# Patient Record
Sex: Female | Born: 1980 | Hispanic: No | Marital: Married | Smoking: Never smoker
Health system: Southern US, Community
[De-identification: ages and names within clinical notes are randomized; demographics above are authoritative.]

## PROBLEM LIST (undated history)

## (undated) DIAGNOSIS — Z789 Other specified health status: Secondary | ICD-10-CM

## (undated) HISTORY — PX: APPENDECTOMY: SHX54

## (undated) SURGERY — Surgical Case
Anesthesia: *Unknown

---

## 2015-08-14 ENCOUNTER — Other Ambulatory Visit: Payer: Self-pay | Admitting: Internal Medicine

## 2015-08-14 DIAGNOSIS — N63 Unspecified lump in unspecified breast: Secondary | ICD-10-CM

## 2015-08-26 ENCOUNTER — Ambulatory Visit
Admission: RE | Admit: 2015-08-26 | Discharge: 2015-08-26 | Disposition: A | Discharge status: Home / Self Care | Payer: Managed Care, Other (non HMO) | Source: Ambulatory Visit | Attending: Internal Medicine | Admitting: Internal Medicine

## 2015-08-26 DIAGNOSIS — R928 Other abnormal and inconclusive findings on diagnostic imaging of breast: Secondary | ICD-10-CM | POA: Diagnosis not present

## 2015-08-26 DIAGNOSIS — Z331 Pregnant state, incidental: Secondary | ICD-10-CM | POA: Diagnosis not present

## 2015-08-26 DIAGNOSIS — N63 Unspecified lump in unspecified breast: Secondary | ICD-10-CM

## 2015-11-14 ENCOUNTER — Ambulatory Visit: Payer: Self-pay | Admitting: Family Medicine

## 2016-07-21 ENCOUNTER — Other Ambulatory Visit: Payer: Self-pay | Admitting: Obstetrics and Gynecology

## 2016-07-21 DIAGNOSIS — Z369 Encounter for antenatal screening, unspecified: Secondary | ICD-10-CM

## 2016-08-03 ENCOUNTER — Ambulatory Visit (HOSPITAL_BASED_OUTPATIENT_CLINIC_OR_DEPARTMENT_OTHER)
Admission: RE | Admit: 2016-08-03 | Discharge: 2016-08-03 | Disposition: A | Discharge status: Home / Self Care | Payer: Commercial Managed Care - PPO | Source: Ambulatory Visit | Attending: Obstetrics and Gynecology | Admitting: Obstetrics and Gynecology

## 2016-08-03 ENCOUNTER — Ambulatory Visit
Admission: RE | Admit: 2016-08-03 | Discharge: 2016-08-03 | Disposition: A | Discharge status: Home / Self Care | Payer: Commercial Managed Care - PPO | Source: Ambulatory Visit | Attending: Obstetrics and Gynecology | Admitting: Obstetrics and Gynecology

## 2016-08-03 ENCOUNTER — Encounter: Payer: Self-pay | Admitting: *Deleted

## 2016-08-03 VITALS — BP 121/75 | HR 101 | Temp 98.0°F | Resp 18 | Ht 67.0 in | Wt 162.0 lb

## 2016-08-03 DIAGNOSIS — D181 Lymphangioma, any site: Secondary | ICD-10-CM | POA: Diagnosis not present

## 2016-08-03 DIAGNOSIS — O358XX0 Maternal care for other (suspected) fetal abnormality and damage, not applicable or unspecified: Secondary | ICD-10-CM | POA: Diagnosis not present

## 2016-08-03 DIAGNOSIS — O3411 Maternal care for benign tumor of corpus uteri, first trimester: Secondary | ICD-10-CM | POA: Diagnosis not present

## 2016-08-03 DIAGNOSIS — O09521 Supervision of elderly multigravida, first trimester: Secondary | ICD-10-CM | POA: Diagnosis not present

## 2016-08-03 DIAGNOSIS — Q792 Exomphalos: Secondary | ICD-10-CM | POA: Insufficient documentation

## 2016-08-03 DIAGNOSIS — Z3A12 12 weeks gestation of pregnancy: Secondary | ICD-10-CM | POA: Diagnosis not present

## 2016-08-03 DIAGNOSIS — Z369 Encounter for antenatal screening, unspecified: Secondary | ICD-10-CM

## 2016-08-03 DIAGNOSIS — Z8279 Family history of other congenital malformations, deformations and chromosomal abnormalities: Secondary | ICD-10-CM | POA: Insufficient documentation

## 2016-08-03 DIAGNOSIS — IMO0002 Reserved for concepts with insufficient information to code with codable children: Secondary | ICD-10-CM | POA: Insufficient documentation

## 2016-08-03 DIAGNOSIS — IMO0001 Reserved for inherently not codable concepts without codable children: Secondary | ICD-10-CM

## 2016-08-03 HISTORY — DX: Other specified health status: Z78.9

## 2016-08-03 NOTE — Progress Notes (Signed)
Referring Provider:   [facility] Length of Consultation: 50 minutes  Sarah Atkinson was referred to [facility] for genetic counseling because of advanced maternal age.  The patient will be 36 years old at the time of delivery.  This note summarizes the information we discussed.    We explained that the chance of a chromosome abnormality increases with maternal age.  Chromosomes and examples of chromosome problems were reviewed.  Humans typically have 46 chromosomes in each cell, with half passed through each sperm and egg.  Any change in the number or structure of chromosomes can increase the risk of problems in the physical and mental development of a pregnancy.   Based upon age of the patient, the chance of any chromosome abnormality was 1 in 95. The chance of Down syndrome, the most common chromosome problem associated with maternal age, was 1 in 62.  The risk of chromosome problems is in addition to the 3% general population risk for birth defects and mental retardation.  The greatest chance, of course, is that the baby would be born in good health.  We discussed the following prenatal screening and testing options for this pregnancy:  First trimester screening, which includes nuchal translucency ultrasound screen and first trimester maternal serum marker screening.  The nuchal translucency has approximately an 80% detection rate for Down syndrome and can be positive for other chromosome abnormalities as well as heart defects.  When combined with a maternal serum marker screening, the detection rate is up to 90% for Down syndrome and up to 97% for trisomy 18.     The chorionic villus sampling procedure is available for first trimester chromosome analysis.  This involves the withdrawal of a small amount of chorionic villi (tissue from the developing placenta).  Risk of pregnancy loss is estimated to be approximately 1 in 200 to 1 in 100 (0.5 to 1%).  There  is approximately a 1% (1 in 100) chance that the CVS chromosome results will be unclear.  Chorionic villi cannot be tested for neural tube defects.     Maternal serum marker screening, a blood test that measures pregnancy proteins, can provide risk assessments for Down syndrome, trisomy 18, and open neural tube defects (spina bifida, anencephaly). Because it does not directly examine the fetus, it cannot positively diagnose or rule out these problems.  Targeted ultrasound uses high frequency sound waves to create an image of the developing fetus.  An ultrasound is often recommended as a routine means of evaluating the pregnancy.  It is also used to screen for fetal anatomy problems (for example, a heart defect) that might be suggestive of a chromosomal or other abnormality.   Amniocentesis involves the removal of a small amount of amniotic fluid from the sac surrounding the fetus with the use of a thin needle inserted through the maternal abdomen and uterus.  Ultrasound guidance is used throughout the procedure.  Fetal cells from amniotic fluid are directly evaluated and > 99.5% of chromosome problems and > 98% of open neural tube defects can be detected. This procedure is generally performed after the 15th week of pregnancy.  The main risks to this procedure include complications leading to miscarriage in less than 1 in 200 cases (0.5%).  We also reviewed the availability of cell free fetal DNA testing from maternal blood to determine whether or not the baby may have either Down syndrome, trisomy 71, or trisomy 43.  This test utilizes a maternal blood sample and  DNA sequencing technology to isolate circulating cell free fetal DNA from maternal plasma.  The fetal DNA can then be analyzed for DNA sequences that are derived from the three most common chromosomes involved in aneuploidy, chromosomes 13, 18, and 21.  If the overall amount of DNA is greater than the expected level for any of these chromosomes,  aneuploidy is suspected.  While we do not consider it a replacement for invasive testing and karyotype analysis, a negative result from this testing would be reassuring, though not a guarantee of a normal chromosome complement for the baby.  An abnormal result is certainly suggestive of an abnormal chromosome complement, though we would still recommend CVS or amniocentesis to confirm any findings from this testing.  Cystic Fibrosis and Spinal Muscular Atrophy (SMA) screening were also discussed with the patient. Both conditions are recessive, which means that both parents must be carriers in order to have a child with the disease.  Cystic fibrosis (CF) is one of the most common genetic conditions in persons of Caucasian ancestry.  This condition occurs in approximately 1 in 2,500 Caucasian persons and results in thickened secretions in the lungs, digestive, and reproductive systems.  For a baby to be at risk for having CF, both of the parents must be carriers for this condition.  Approximately 1 in 91 Caucasian persons is a carrier for CF.  Current carrier testing looks for the most common mutations in the gene for CF and can detect approximately 90% of carriers in the Caucasian population.  This means that the carrier screening can greatly reduce, but cannot eliminate, the chance for an individual to have a child with CF.  If an individual is found to be a carrier for CF, then carrier testing would be available for the partner. As part of [place] newborn screening profile, all babies born in the state of [place] will have a two-tier screening process.  Specimens are first tested to determine the concentration of immunoreactive trypsinogen (IRT).  The top 5% of specimens with the highest IRT values then undergo DNA testing using a panel of over 40 common CF mutations. SMA is a neurodegenerative disorder that leads to atrophy of skeletal muscle and overall weakness.  This condition is also more  prevalent in the Caucasian population, with 1 in 40-1 in 60 persons being a carrier and 1 in 6,000-1 in 10,000 children being affected.  There are multiple forms of the disease, with some causing death in infancy to other forms with survival into adulthood.  The genetics of SMA is complex, but carrier screening can detect up to 95% of carriers in the Caucasian population.  Similar to CF, a negative result can greatly reduce, but cannot eliminate, the chance to have a child with SMA.  We obtained a detailed family history and pregnancy history.  Sarah Atkinson reported one paternal aunt with Down syndrome.  This aunt was the youngest of 18 siblings in her father's family, and she was born when their mother was much older.  Down syndrome is caused by an extra copy of the genetic instructions located on chromosome number 21.  These extra instructions result in the characteristic facial appearance, developmental delays, and an increased risk for some types of birth defects.  The majority (95%) of persons with Down syndrome have three freestanding copies of chromosome number 21, called trisomy 42.  This type of Down syndrome occurs as a sporadic condition, often related to advanced maternal age, and does not increase the  chance for other family members to have Down syndrome.  Rarely, Down syndrome is caused by a rearrangement of the genetic instructions, where the extra chromosome number 21 is attached to another chromosome.  This type of chromosome rearrangement can be passed down through families and therefore may increase the chance for Down syndrome in other family members.  We cannot determine which type of Down syndrome is present without documentation of chromosome studies in the affected family member.  If any additional information is obtained about this history, please do not hesitate to contact us. In addition, Wilton's brother has a son with autistic features.  Autism spectrum disorders may have many  different causes, including genetic syndromes. In order to determine the chance for other family members to have a similar condition, more medical information would be needed.  Fragile X syndrome, the most common inherited form of developmental delay and a common syndrome associated with autism, would not be concerning for this pregnancy because it cannot be passed from father to son.  The remainder of the family history is unremarkable for birth defects, developmental delays, recurrent pregnancy loss or known chromosome abnormalities.  Sarah Atkinson stated that this is her third pregnancy.  She and her husband, Lavone Neri, have a three year old son who is in good health.  The other pregnancy was an elective termination for personal reasons.  She reported no complications or exposures in this pregnancy that would be expected to increase the risk for birth defects.  She had taken sleeping pills from [place] occasionally prior to her missed period, but stopped at 4 weeks.  She was not sure of the name or the contents of the pills.  We reviewed that this was most likely in the all or none period and would have little concern, but that without knowing the contents, we cannot accurately assess the risks from this exposure.    After consideration of the options, Sarah Atkinson elected to proceed with first trimester screening.  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  Detailed views of the fetal anatomy could not be assessed due to early gestational age.  Please refer to the ultrasound report for details of that study.  We met and discussed these results further.  When cystic hygroma is identified on prenatal ultrasound, there is a significantly increased chance for a chromosome condition, heart defect or other genetic syndrome. We reviewed that up to 60% of fetuses with a cystic hygroma have chromosome abnormalities, including Turner  Syndrome, Down Syndrome and trisomy 35 or 13. The additional finding of omphalocele would increase this risk further.  We reviewed testing options including chorionic villus sampling, cell free fetal DNA analysis, expectant management and the option of termination of pregnancy. An increased risk of miscarriage is also associated with a cystic hygroma.   The patient elected to proceed with cell free fetal DNA testing today.  We will plan to speak by phone when those results become available (usually in about 1 week).  At that time, we will make a plan for follow up based upon her wishes.  Sarah Atkinson was encouraged to call with questions or concerns.  We can be contacted at [phone].    Wilburt Finlay, MS, CGC

## 2016-08-03 NOTE — Progress Notes (Signed)
Sarah Larry, MS, CGC performed an integral service incident to the physician's initial service.  I was physically present in the clinical area and was immediately available to render assistance.   [name] [name]

## 2016-08-09 LAB — INFORMASEQ(SM) WITH XY ANALYSIS
Chromosome 18 Result: DETECTED
Fetal Fraction (%):: 7.8
Fetal Number: 1
Gestational Age at Collection: 12.4 weeks
Screen Result: DETECTED
Weight: 162 [lb_av]

## 2016-08-10 ENCOUNTER — Telehealth: Payer: Self-pay | Admitting: Obstetrics and Gynecology

## 2016-08-10 NOTE — Telephone Encounter (Signed)
I spoke with Sarah Atkinson and her husband, Sarah Atkinson, by phone today with the results of the cell free DNA testing performed on 08/03/2016 at [facility].  This testing was offered based upon maternal age and the findings of cystic hygroma, lymphangiectasia and omphalocele on ultrasound at 12 weeks.    To review, circulating cell free fetal DNA testing may be used to determine whether or not the baby may have either Down syndrome, trisomy 84, or trisomy 54. This test utilizes a maternal blood sample and DNA sequencing technology to isolate circulating cell free fetal DNA from maternal plasma. The fetal DNA can then be analyzed for DNA sequences that are derived from the three most common chromosomes involved in aneuploidy, chromosomes 13, 18, and 21. If the overall amount of DNA is greater than the expected level for any of these chromosomes, aneuploidy is suspected. This testing is able to provide another means of determining the chance for one of these common chromosome conditions, without requiring an invasive procedure and traditional karyotype analysis. We explained that we do not consider it a replacement for invasive testing and karyotype analysis.  A negative result from this testing is very reassuring, though not a guarantee of a normal chromosome complement for the baby. An abnormal result is certainly suggestive of an abnormal chromosome complement, though we would still recommend CVS or amniocentesis to confirm any findings from this testing.  Results of the InformaSeq testing were abnormal, showing a high risk for trisomy 18 in the pregnancy.  This test is estimated to be 98% accurate for trisomy 18.  We then talked about the high likelihood for this chromosome condition in the pregnancy given these results along with the prior clinical findings.  However, this is not diagnostic and therefore we offered the option of CVS or amniocentesis for confirmation of the fetal  karyotype as well as detailed ultrasound and echocardiogram to provide more information regarding the health of this baby.  We also discussed continuing the pregnancy and the option of termination prior to [redacted] weeks gestation.  Sarah Atkinson stated that she desires termination of pregnancy given these results.  At her request, we contacted the [facility] at [facility] and provided her clinical information.  They will call her insurance company to determine coverage and call the patient as soon as possible with cost information and to schedule as desired.  We discussed the option of chromosome analysis at the time of termination to confirm the diagnosis and verify trisomy vs. Translocation trisomy 11.  The patient was advised that this would be helpful in accurately determining recurrence risks.  She and her husband plan to consider this.  We review recurrence chances for chromosome conditions, as the patient was asking about future pregnancies.  If this is trisomy 58, then the recurrence chance would be 1% or her age related risk, whichever is higher (her age realted chance after age 69).  If there is translocation trisomy 40, then the recurrence would depend upon if either parent was a carrier.  The patient and her husband have my cell number, and can call me with questions.  Our clinic number is [phone], and I may be reached there on Mondays and Thursdays.  I have spoken with [facility] about these results as well.  Wilburt Finlay, MS, CGC

## 2017-05-07 ENCOUNTER — Encounter (HOSPITAL_COMMUNITY): Payer: Self-pay

## 2017-08-26 ENCOUNTER — Other Ambulatory Visit: Payer: Self-pay | Admitting: Internal Medicine

## 2017-08-26 DIAGNOSIS — J42 Unspecified chronic bronchitis: Secondary | ICD-10-CM

## 2017-08-26 DIAGNOSIS — J4541 Moderate persistent asthma with (acute) exacerbation: Secondary | ICD-10-CM

## 2017-09-03 ENCOUNTER — Ambulatory Visit
Admission: RE | Admit: 2017-09-03 | Discharge: 2017-09-03 | Disposition: A | Discharge status: Home / Self Care | Payer: Commercial Managed Care - PPO | Source: Ambulatory Visit | Attending: Internal Medicine | Admitting: Internal Medicine

## 2017-09-03 DIAGNOSIS — J4541 Moderate persistent asthma with (acute) exacerbation: Secondary | ICD-10-CM

## 2017-09-03 DIAGNOSIS — R062 Wheezing: Secondary | ICD-10-CM | POA: Diagnosis present

## 2017-09-03 DIAGNOSIS — R05 Cough: Secondary | ICD-10-CM | POA: Diagnosis not present

## 2017-09-03 DIAGNOSIS — J42 Unspecified chronic bronchitis: Secondary | ICD-10-CM

## 2017-09-03 DIAGNOSIS — R0602 Shortness of breath: Secondary | ICD-10-CM | POA: Insufficient documentation

## 2017-09-30 ENCOUNTER — Other Ambulatory Visit: Payer: Self-pay | Admitting: Specialist

## 2017-09-30 DIAGNOSIS — R059 Cough, unspecified: Secondary | ICD-10-CM

## 2017-09-30 DIAGNOSIS — R05 Cough: Secondary | ICD-10-CM

## 2017-09-30 DIAGNOSIS — J4541 Moderate persistent asthma with (acute) exacerbation: Secondary | ICD-10-CM

## 2017-10-14 ENCOUNTER — Ambulatory Visit: Payer: Commercial Managed Care - PPO

## 2017-10-28 ENCOUNTER — Ambulatory Visit: Payer: Commercial Managed Care - PPO

## 2017-11-02 ENCOUNTER — Ambulatory Visit: Payer: Commercial Managed Care - PPO | Attending: Specialist

## 2017-11-02 DIAGNOSIS — J4541 Moderate persistent asthma with (acute) exacerbation: Secondary | ICD-10-CM

## 2017-11-02 DIAGNOSIS — R05 Cough: Secondary | ICD-10-CM | POA: Diagnosis not present

## 2017-11-02 DIAGNOSIS — R059 Cough, unspecified: Secondary | ICD-10-CM

## 2017-11-02 MED ORDER — METHACHOLINE 0.0625 MG/ML NEB SOLN
2.0000 mL | Freq: Once | RESPIRATORY_TRACT | Status: AC
  Administered 2017-11-02: 0.125 mg via RESPIRATORY_TRACT
  Filled 2017-11-02: qty 2

## 2017-11-02 MED ORDER — METHACHOLINE 0.25 MG/ML NEB SOLN
2.0000 mL | Freq: Once | RESPIRATORY_TRACT | Status: AC
  Administered 2017-11-02: 0.5 mg via RESPIRATORY_TRACT
  Filled 2017-11-02: qty 2

## 2017-11-02 MED ORDER — METHACHOLINE 4 MG/ML NEB SOLN
2.0000 mL | Freq: Once | RESPIRATORY_TRACT | Status: AC
  Administered 2017-11-02: 8 mg via RESPIRATORY_TRACT
  Filled 2017-11-02: qty 2

## 2017-11-02 MED ORDER — SODIUM CHLORIDE 0.9 % IN NEBU
3.0000 mL | INHALATION_SOLUTION | Freq: Once | RESPIRATORY_TRACT | Status: AC
  Administered 2017-11-02: 3 mL via RESPIRATORY_TRACT

## 2017-11-02 MED ORDER — METHACHOLINE 16 MG/ML NEB SOLN
2.0000 mL | Freq: Once | RESPIRATORY_TRACT | Status: AC
  Administered 2017-11-02: 32 mg via RESPIRATORY_TRACT
  Filled 2017-11-02: qty 2

## 2017-11-02 MED ORDER — ALBUTEROL SULFATE (2.5 MG/3ML) 0.083% IN NEBU
2.5000 mg | INHALATION_SOLUTION | Freq: Once | RESPIRATORY_TRACT | Status: AC
  Filled 2017-11-02: qty 3

## 2017-11-02 MED ORDER — METHACHOLINE 1 MG/ML NEB SOLN
2.0000 mL | Freq: Once | RESPIRATORY_TRACT | Status: AC
  Administered 2017-11-02: 2 mg via RESPIRATORY_TRACT
  Filled 2017-11-02: qty 2

## 2018-03-31 ENCOUNTER — Other Ambulatory Visit: Payer: Self-pay | Admitting: Obstetrics and Gynecology

## 2018-03-31 DIAGNOSIS — Z1231 Encounter for screening mammogram for malignant neoplasm of breast: Secondary | ICD-10-CM

## 2018-04-07 ENCOUNTER — Encounter: Payer: Self-pay | Admitting: Radiology

## 2018-04-07 ENCOUNTER — Ambulatory Visit
Admission: RE | Admit: 2018-04-07 | Discharge: 2018-04-07 | Disposition: A | Discharge status: Home / Self Care | Payer: Commercial Managed Care - PPO | Source: Ambulatory Visit | Attending: Obstetrics and Gynecology | Admitting: Obstetrics and Gynecology

## 2018-04-07 DIAGNOSIS — Z1231 Encounter for screening mammogram for malignant neoplasm of breast: Secondary | ICD-10-CM | POA: Diagnosis present

## 2018-05-18 DIAGNOSIS — E119 Type 2 diabetes mellitus without complications: Secondary | ICD-10-CM | POA: Diagnosis not present

## 2018-06-13 DIAGNOSIS — R87612 Low grade squamous intraepithelial lesion on cytologic smear of cervix (LGSIL): Secondary | ICD-10-CM | POA: Diagnosis not present

## 2018-06-13 DIAGNOSIS — G4709 Other insomnia: Secondary | ICD-10-CM | POA: Diagnosis not present

## 2018-06-18 DIAGNOSIS — E119 Type 2 diabetes mellitus without complications: Secondary | ICD-10-CM | POA: Diagnosis not present

## 2018-07-04 DIAGNOSIS — N912 Amenorrhea, unspecified: Secondary | ICD-10-CM | POA: Diagnosis not present

## 2018-07-05 ENCOUNTER — Other Ambulatory Visit: Payer: Self-pay | Admitting: Obstetrics and Gynecology

## 2018-07-05 DIAGNOSIS — Z369 Encounter for antenatal screening, unspecified: Secondary | ICD-10-CM

## 2018-07-07 DIAGNOSIS — O209 Hemorrhage in early pregnancy, unspecified: Secondary | ICD-10-CM | POA: Diagnosis not present

## 2018-07-07 DIAGNOSIS — O418X10 Other specified disorders of amniotic fluid and membranes, first trimester, not applicable or unspecified: Secondary | ICD-10-CM | POA: Diagnosis not present

## 2018-07-07 DIAGNOSIS — O468X1 Other antepartum hemorrhage, first trimester: Secondary | ICD-10-CM | POA: Diagnosis not present

## 2018-07-15 DIAGNOSIS — J40 Bronchitis, not specified as acute or chronic: Secondary | ICD-10-CM | POA: Diagnosis not present

## 2018-07-17 DIAGNOSIS — E119 Type 2 diabetes mellitus without complications: Secondary | ICD-10-CM | POA: Diagnosis not present

## 2018-08-03 DIAGNOSIS — R0989 Other specified symptoms and signs involving the circulatory and respiratory systems: Secondary | ICD-10-CM | POA: Diagnosis not present

## 2018-08-08 ENCOUNTER — Ambulatory Visit: Payer: Commercial Managed Care - PPO

## 2018-08-15 DIAGNOSIS — N920 Excessive and frequent menstruation with regular cycle: Secondary | ICD-10-CM | POA: Diagnosis not present

## 2018-08-15 DIAGNOSIS — D219 Benign neoplasm of connective and other soft tissue, unspecified: Secondary | ICD-10-CM | POA: Diagnosis not present

## 2018-08-15 DIAGNOSIS — D251 Intramural leiomyoma of uterus: Secondary | ICD-10-CM | POA: Diagnosis not present

## 2018-08-17 DIAGNOSIS — D251 Intramural leiomyoma of uterus: Secondary | ICD-10-CM | POA: Diagnosis not present

## 2018-08-17 DIAGNOSIS — D219 Benign neoplasm of connective and other soft tissue, unspecified: Secondary | ICD-10-CM | POA: Diagnosis not present

## 2018-08-17 DIAGNOSIS — E119 Type 2 diabetes mellitus without complications: Secondary | ICD-10-CM | POA: Diagnosis not present

## 2018-08-17 DIAGNOSIS — R300 Dysuria: Secondary | ICD-10-CM | POA: Diagnosis not present

## 2018-08-17 DIAGNOSIS — N920 Excessive and frequent menstruation with regular cycle: Secondary | ICD-10-CM | POA: Diagnosis not present

## 2018-11-02 ENCOUNTER — Other Ambulatory Visit: Payer: Self-pay | Admitting: Obstetrics and Gynecology

## 2018-11-02 DIAGNOSIS — D250 Submucous leiomyoma of uterus: Secondary | ICD-10-CM

## 2018-11-09 ENCOUNTER — Other Ambulatory Visit: Payer: Commercial Managed Care - PPO

## 2019-06-28 ENCOUNTER — Other Ambulatory Visit: Payer: Self-pay | Admitting: Unknown Physician Specialty

## 2019-06-28 DIAGNOSIS — R1310 Dysphagia, unspecified: Secondary | ICD-10-CM

## 2019-06-28 DIAGNOSIS — K219 Gastro-esophageal reflux disease without esophagitis: Secondary | ICD-10-CM

## 2019-07-06 ENCOUNTER — Ambulatory Visit: Payer: Commercial Managed Care - PPO

## 2019-07-16 ENCOUNTER — Ambulatory Visit: Payer: Commercial Managed Care - PPO | Attending: Internal Medicine

## 2019-07-16 DIAGNOSIS — Z23 Encounter for immunization: Secondary | ICD-10-CM | POA: Insufficient documentation

## 2019-07-16 NOTE — Progress Notes (Signed)
   Covid-19 Vaccination Clinic  Name:  Sarah Atkinson    MRN: [id number] DOB: 1980/06/20  07/16/2019  [name] was observed post Covid-19 immunization for 15 minutes without incidence. She was provided with Vaccine Information Sheet and instruction to access the V-Safe system.   [name] was instructed to call 911 with any severe reactions post vaccine: Marland Kitchen Difficulty breathing  . Swelling of your face and throat  . A fast heartbeat  . A bad rash all over your body  . Dizziness and weakness    Immunizations Administered    Name Date Dose VIS Date Route   Pfizer COVID-19 Vaccine 07/16/2019  2:49 PM 0.3 mL 04/28/2019 Intramuscular   Manufacturer: [place]   Lot: [id number]   [place]: [id number]

## 2019-08-09 ENCOUNTER — Ambulatory Visit: Payer: Commercial Managed Care - PPO | Attending: Internal Medicine

## 2019-08-09 DIAGNOSIS — Z23 Encounter for immunization: Secondary | ICD-10-CM

## 2019-08-09 NOTE — Progress Notes (Signed)
   Covid-19 Vaccination Clinic  Name:  Sarah Atkinson    MRN: [id number] DOB: 1980-10-22  08/09/2019  Sarah Atkinson was observed post Covid-19 immunization for 15 minutes without incident. She was provided with Vaccine Information Sheet and instruction to access the V-Safe system.   Sarah Atkinson was instructed to call 911 with any severe reactions post vaccine: Marland Kitchen Difficulty breathing  . Swelling of face and throat  . A fast heartbeat  . A bad rash all over body  . Dizziness and weakness   Immunizations Administered    Name Date Dose VIS Date Route   Pfizer COVID-19 Vaccine 08/09/2019  3:18 PM 0.3 mL 04/28/2019 Intramuscular   Manufacturer: [place]   Lot: [id number]   [place]: [id number]

## 2020-05-22 ENCOUNTER — Other Ambulatory Visit: Payer: Commercial Managed Care - PPO

## 2020-05-23 ENCOUNTER — Other Ambulatory Visit: Payer: Commercial Managed Care - PPO

## 2021-01-30 ENCOUNTER — Other Ambulatory Visit: Payer: Self-pay | Admitting: Internal Medicine

## 2021-01-30 DIAGNOSIS — Z1231 Encounter for screening mammogram for malignant neoplasm of breast: Secondary | ICD-10-CM

## 2021-02-13 ENCOUNTER — Other Ambulatory Visit: Payer: Self-pay

## 2021-02-13 ENCOUNTER — Ambulatory Visit
Admission: RE | Admit: 2021-02-13 | Discharge: 2021-02-13 | Disposition: A | Discharge status: Home / Self Care | Payer: BC Managed Care – PPO | Source: Ambulatory Visit | Attending: Internal Medicine | Admitting: Internal Medicine

## 2021-02-13 DIAGNOSIS — Z1231 Encounter for screening mammogram for malignant neoplasm of breast: Secondary | ICD-10-CM | POA: Diagnosis present

## 2022-01-08 ENCOUNTER — Other Ambulatory Visit: Payer: Self-pay | Admitting: Internal Medicine

## 2022-01-08 DIAGNOSIS — Z1231 Encounter for screening mammogram for malignant neoplasm of breast: Secondary | ICD-10-CM

## 2022-02-17 ENCOUNTER — Ambulatory Visit
Admission: RE | Admit: 2022-02-17 | Discharge: 2022-02-17 | Disposition: A | Discharge status: Home / Self Care | Payer: Commercial Managed Care - PPO | Source: Ambulatory Visit | Attending: Internal Medicine | Admitting: Internal Medicine

## 2022-02-17 DIAGNOSIS — Z1231 Encounter for screening mammogram for malignant neoplasm of breast: Secondary | ICD-10-CM | POA: Insufficient documentation

## 2022-03-28 IMAGING — MG MM DIGITAL SCREENING BILAT W/ TOMO AND CAD
8 series · 9 of 24 positions shown · non-contrast
Comparison: Previous exam(s).

CLINICAL DATA: Screening.

EXAM:
DIGITAL SCREENING BILATERAL MAMMOGRAM WITH TOMOSYNTHESIS AND CAD
TECHNIQUE: Bilateral screening digital craniocaudal and mediolateral oblique
mammograms were obtained. Bilateral screening digital breast
tomosynthesis was performed. The images were evaluated with
computer-aided detection.

[R MLO synth-2D]
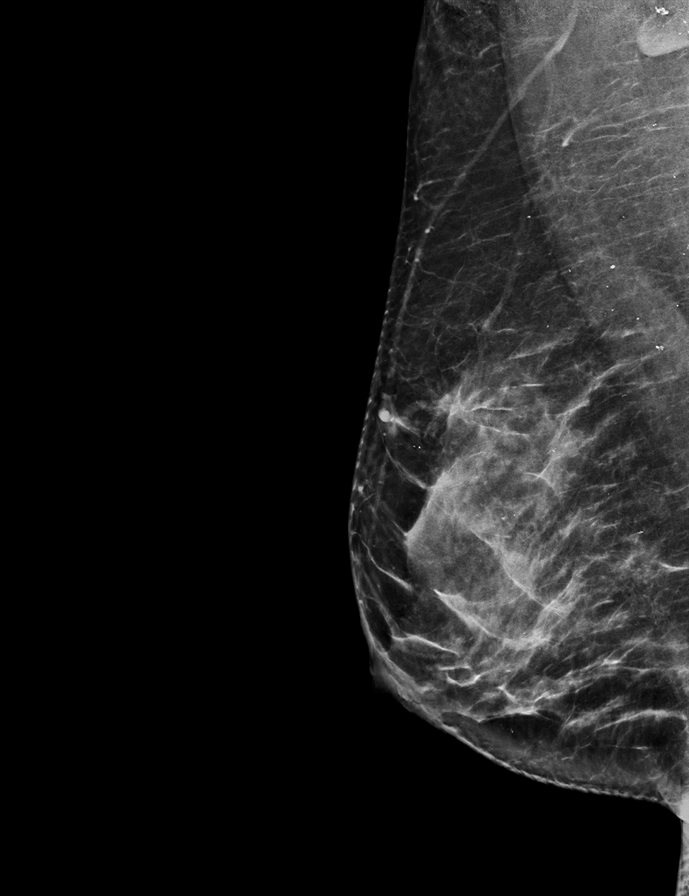

[L CC synth-2D]
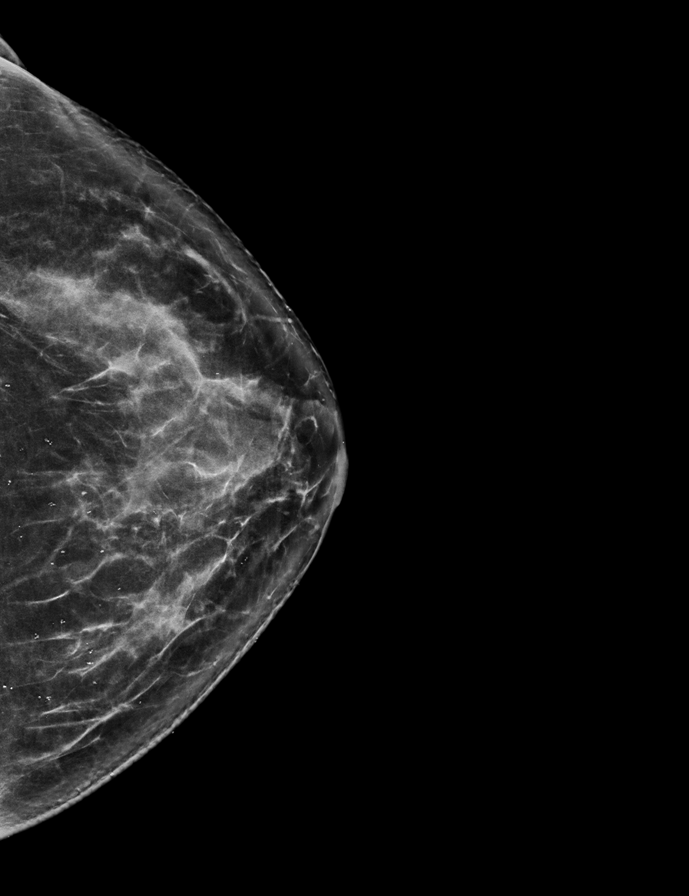

[L MLO synth-2D]
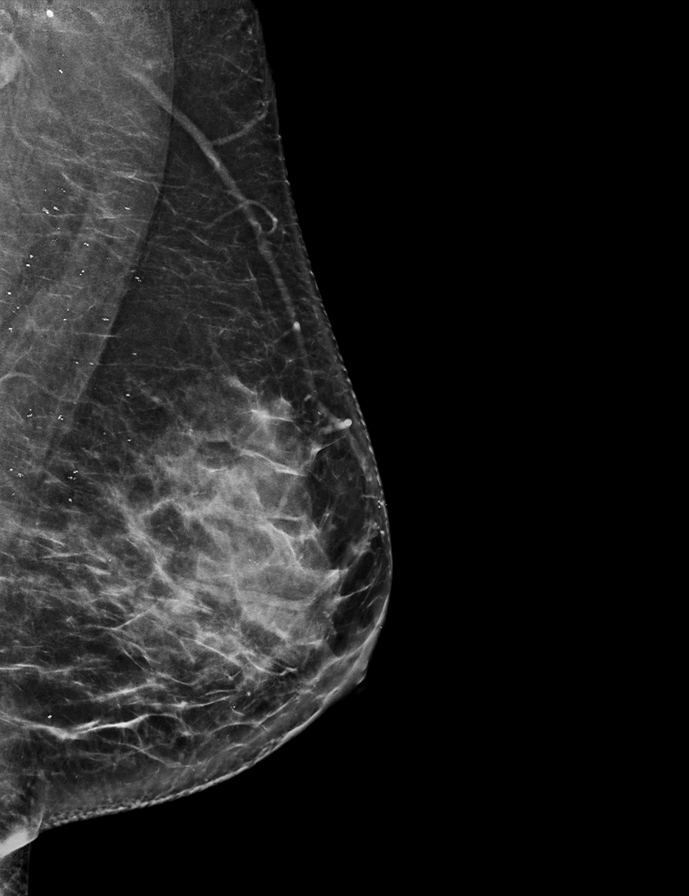

[R CC synth-2D]
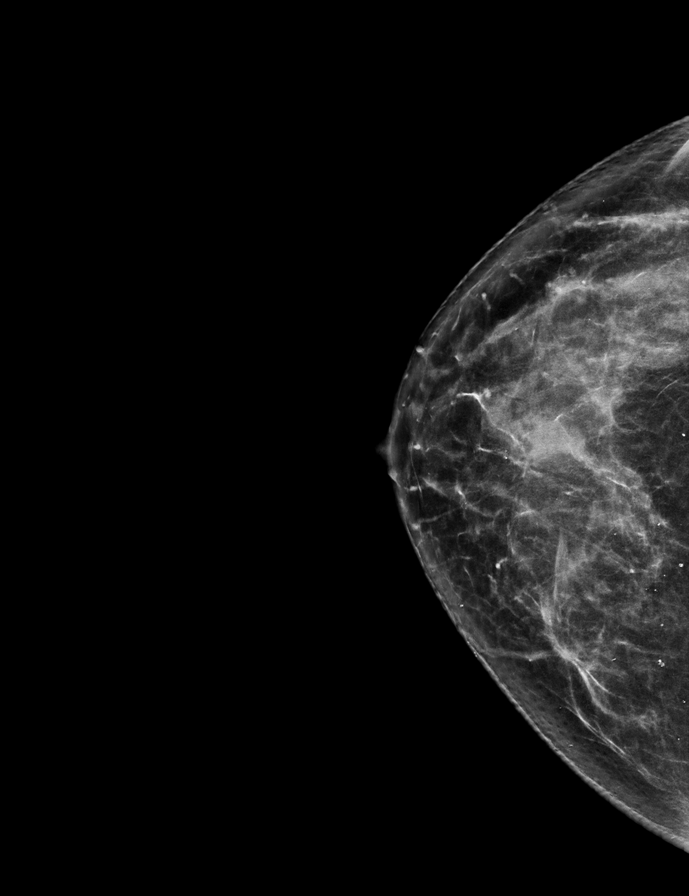

[R MLO tomo · 2 of 73 frames shown]
[frame 24/73]
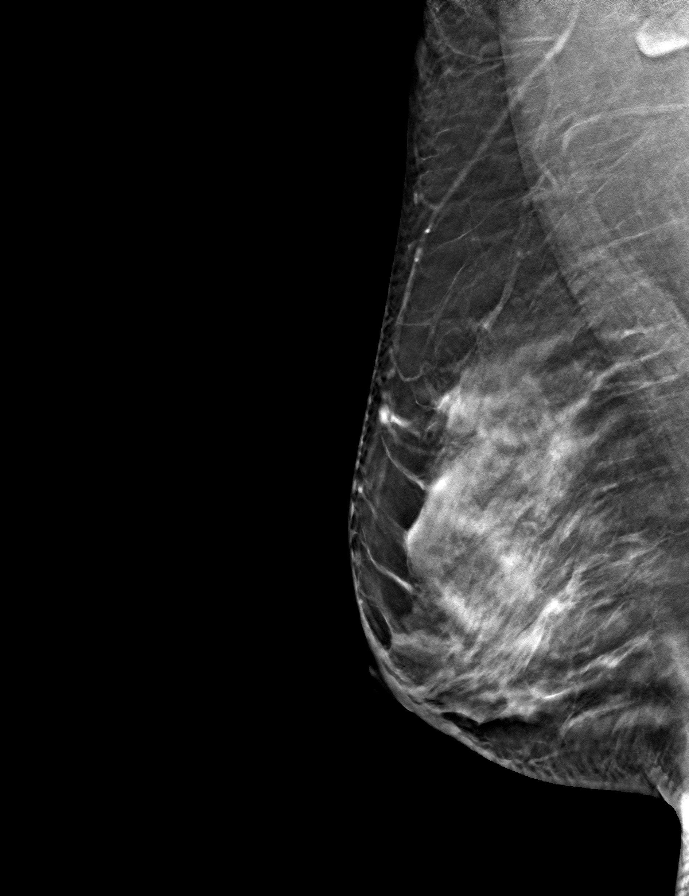
[frame 37/73]
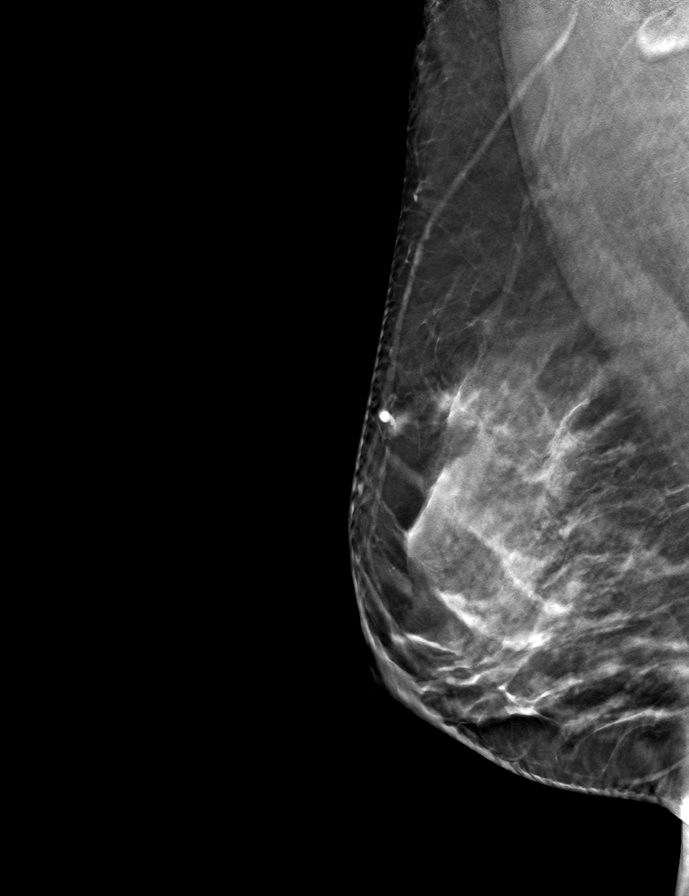

[R CC tomo · tomo slice 33/65.0]
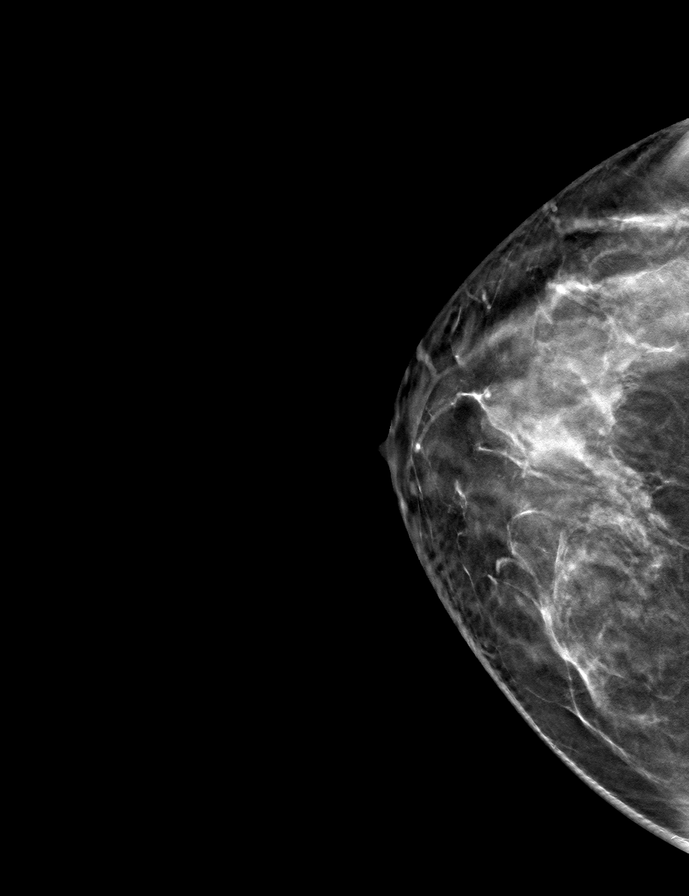

[L CC tomo · tomo slice 36/71.0]
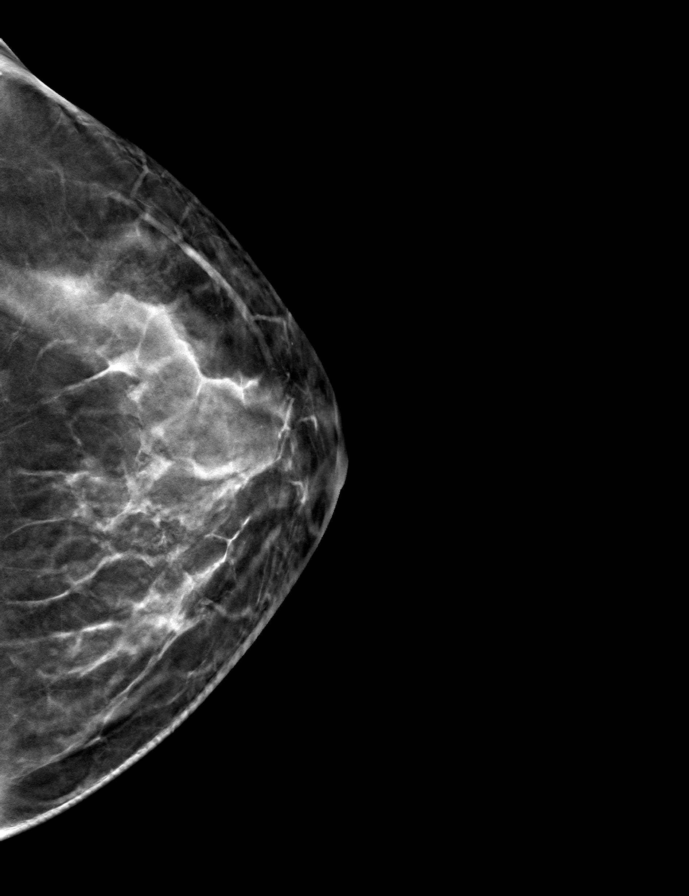

[L MLO tomo · tomo slice 38/75.0]
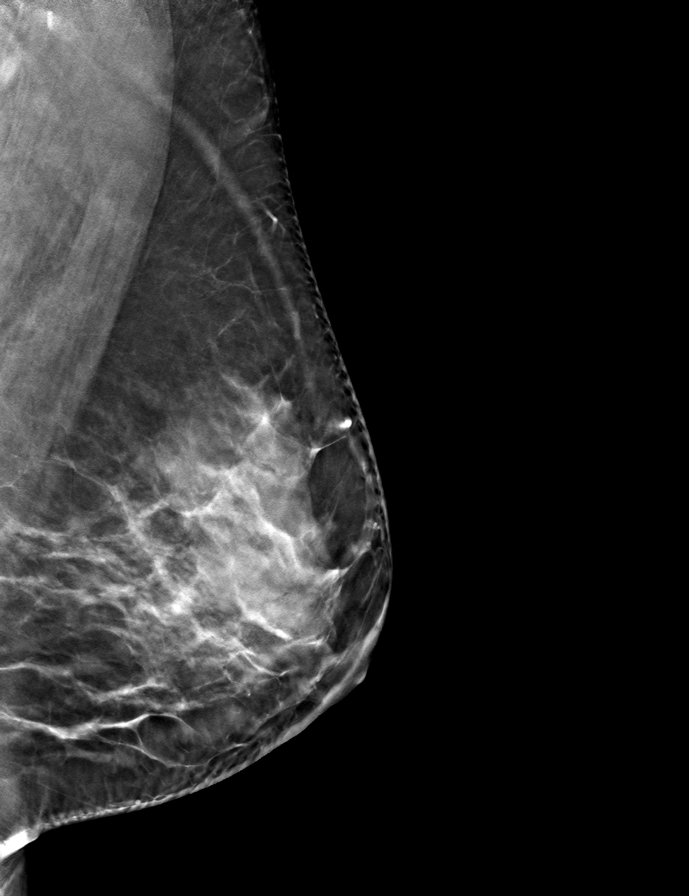

[9 of 24 positions shown; findings below may reference images not displayed]

ACR Breast Density Category d: The breast tissue is extremely dense,
which lowers the sensitivity of mammography
FINDINGS: There are no findings suspicious for malignancy.
IMPRESSION: No mammographic evidence of malignancy. A result letter of this
screening mammogram will be mailed directly to the patient.

RECOMMENDATION:
Screening mammogram in one year. (Code:[id number])

BI-RADS CATEGORY  1: Negative.

## 2023-12-15 ENCOUNTER — Other Ambulatory Visit: Payer: Self-pay | Admitting: Obstetrics and Gynecology

## 2023-12-15 DIAGNOSIS — Z1231 Encounter for screening mammogram for malignant neoplasm of breast: Secondary | ICD-10-CM

## 2024-01-04 ENCOUNTER — Encounter

## 2024-01-24 ENCOUNTER — Ambulatory Visit

## 2024-01-26 ENCOUNTER — Ambulatory Visit
Admission: RE | Admit: 2024-01-26 | Discharge: 2024-01-26 | Disposition: A | Discharge status: Home / Self Care | Source: Ambulatory Visit | Attending: Obstetrics and Gynecology | Admitting: Obstetrics and Gynecology

## 2024-01-26 DIAGNOSIS — Z1231 Encounter for screening mammogram for malignant neoplasm of breast: Secondary | ICD-10-CM | POA: Insufficient documentation
